# Patient Record
Sex: Male | Born: 1990 | Race: Black or African American | Hispanic: No | Marital: Single | Smoking: Never smoker
Health system: Southern US, Community
[De-identification: ages and names within clinical notes are randomized; demographics above are authoritative.]

## PROBLEM LIST (undated history)

## (undated) DIAGNOSIS — T7840XA Allergy, unspecified, initial encounter: Secondary | ICD-10-CM

## (undated) HISTORY — DX: Allergy, unspecified, initial encounter: T78.40XA

---

## 2012-01-04 ENCOUNTER — Ambulatory Visit (INDEPENDENT_AMBULATORY_CARE_PROVIDER_SITE_OTHER): Payer: Federal, State, Local not specified - PPO | Admitting: Family Medicine

## 2012-01-04 VITALS — BP 116/78 | HR 60 | Temp 98.6°F | Resp 12 | Ht 72.0 in | Wt 190.6 lb

## 2012-01-04 DIAGNOSIS — Z202 Contact with and (suspected) exposure to infections with a predominantly sexual mode of transmission: Secondary | ICD-10-CM

## 2012-01-04 DIAGNOSIS — Z0000 Encounter for general adult medical examination without abnormal findings: Secondary | ICD-10-CM

## 2012-01-04 DIAGNOSIS — H6120 Impacted cerumen, unspecified ear: Secondary | ICD-10-CM

## 2012-01-04 DIAGNOSIS — Z113 Encounter for screening for infections with a predominantly sexual mode of transmission: Secondary | ICD-10-CM

## 2012-01-04 LAB — COMPREHENSIVE METABOLIC PANEL
ALT: 27 U/L (ref 0–53)
AST: 30 U/L (ref 0–37)
Chloride: 99 mEq/L (ref 96–112)
Creat: 1.06 mg/dL (ref 0.50–1.35)
Sodium: 140 mEq/L (ref 135–145)
Total Bilirubin: 0.8 mg/dL (ref 0.3–1.2)

## 2012-01-04 LAB — POCT CBC
Granulocyte percent: 66.9 % (ref 37–80)
HCT, POC: 51.3 % (ref 43.5–53.7)
Hemoglobin: 15.8 g/dL (ref 14.1–18.1)
Lymph, poc: 1.8 (ref 0.6–3.4)
MCH, POC: 28.5 pg (ref 27–31.2)
MCHC: 30.8 g/dL — AB (ref 31.8–35.4)
MCV: 92.4 fL (ref 80–97)
MID (cbc): 0.4 (ref 0–0.9)
MPV: 10.7 fL (ref 0–99.8)
POC Granulocyte: 4.3 (ref 2–6.9)
POC LYMPH PERCENT: 27.5 % (ref 10–50)
POC MID %: 5.6 %M (ref 0–12)
Platelet Count, POC: 290 10*3/uL (ref 142–424)
RBC: 5.55 M/uL (ref 4.69–6.13)
RDW, POC: 12.7 %
WBC: 6.4 10*3/uL (ref 4.6–10.2)

## 2012-01-04 LAB — COMPREHENSIVE METABOLIC PANEL WITH GFR
Albumin: 4.9 g/dL (ref 3.5–5.2)
Alkaline Phosphatase: 76 U/L (ref 39–117)
BUN: 10 mg/dL (ref 6–23)
CO2: 32 meq/L (ref 19–32)
Calcium: 10.7 mg/dL — ABNORMAL HIGH (ref 8.4–10.5)
Glucose, Bld: 85 mg/dL (ref 70–99)
Potassium: 4 meq/L (ref 3.5–5.3)
Total Protein: 8.1 g/dL (ref 6.0–8.3)

## 2012-01-04 MED ORDER — AZITHROMYCIN 250 MG PO TABS: ORAL_TABLET | ORAL | Status: DC

## 2012-01-04 NOTE — Progress Notes (Signed)
[facility]:  Office Visit  Chief Complaint:  Chief Complaint  Patient presents with  . Annual Exam  . SEXUALLY TRANSMITTED DISEASE    check     HPI: Fernando Henderson is a 21 y.o. male who complains of annual and also exposure to chlamydia from girlfriend. He is a Consulting civil engineer at  [employer], Librarian, academic. No prior h/o STI. He recently was told by girlfriend that she has chlamydia. No sxs. Has been tested 2-3 x for STD and has never had one.   Past Medical History  Diagnosis Date  . Allergy    History reviewed. No pertinent past surgical history. History   Social History  . Marital Status: Single    Spouse Name: N/A    Number of Children: N/A  . Years of Education: N/A   Social History Main Topics  . Smoking status: Never Smoker   . Smokeless tobacco: None  . Alcohol Use: Yes  . Drug Use: No  . Sexually Active: Yes    Birth Control/ Protection: Condom   Other Topics Concern  . None   Social History Narrative  . None   Family History  Problem Relation Age of Onset  . Cancer Maternal Grandmother    No Known Allergies Prior to Admission medications   Not on File     ROS: The patient denies fevers, chills, night sweats, unintentional weight loss, chest pain, palpitations, wheezing, dyspnea on exertion, nausea, vomiting, abdominal pain, dysuria, hematuria, melena, numbness, weakness, or tingling. NO urethral dc  All other systems have been reviewed and were otherwise negative with the exception of those mentioned in the HPI and as above.    PHYSICAL EXAM: Filed Vitals:   01/04/12 1430  BP: 116/78  Pulse: 60  Temp: 98.6 F (37 C)  Resp: 12   Filed Vitals:   01/04/12 1430  Height: 6' (1.829 m)  Weight: 190 lb 9.6 oz (86.456 kg)   Body mass index is 25.85 kg/(m^2).  General: Alert, no acute distress HEENT:  Normocephalic, atraumatic, oropharynx patent.  Cardiovascular:  Regular rate and rhythm, no rubs murmurs or gallops.   No Carotid bruits, radial pulse intact. No pedal edema.  Respiratory: Clear to auscultation bilaterally.  No wheezes, rales, or rhonchi.  No cyanosis, no use of accessory musculature GI: No organomegaly, abdomen is soft and non-tender, positive bowel sounds.  No masses. Skin: No rashes. Neurologic: Facial musculature symmetric. Psychiatric: Patient is appropriate throughout our interaction. Lymphatic: No cervical lymphadenopathy Musculoskeletal: Gait intact. Gu normal, circ, no rashes, lesions, dc   LABS: Results for orders placed in visit on 01/04/12  POCT CBC      Component Value Range   WBC 6.4  4.6 - 10.2 K/uL   Lymph, poc 1.8  0.6 - 3.4   POC LYMPH PERCENT 27.5  10 - 50 %L   MID (cbc) 0.4  0 - 0.9   POC MID % 5.6  0 - 12 %M   POC Granulocyte 4.3  2 - 6.9   Granulocyte percent 66.9  37 - 80 %G   RBC 5.55  4.69 - 6.13 M/uL   Hemoglobin 15.8  14.1 - 18.1 g/dL   HCT, POC 56.2  13.0 - 53.7 %   MCV 92.4  80 - 97 fL   MCH, POC 28.5  27 - 31.2 pg   MCHC 30.8 (*) 31.8 - 35.4 g/dL   RDW, POC 86.5     Platelet Count, POC 290  142 - 424 K/uL   MPV 10.7  0 - 99.8 fL     EKG/XRAY:   Primary read interpreted by Dr. Conley Rolls at [facility].   ASSESSMENT/PLAN: Encounter Diagnoses  Name Primary?  . Annual physical exam Yes  . Screening for STD (sexually transmitted disease)   . Cerumen impaction   . Chlamydia contact    Will treat with Azithromycin since has had contact Labs pending Advise to protect himself Excuse note for school just for today    Nalani Andreen PHUONG, DO 01/04/2012 3:16 PM

## 2012-01-05 ENCOUNTER — Telehealth: Payer: Self-pay | Admitting: Family Medicine

## 2012-01-05 LAB — GC/CHLAMYDIA PROBE AMP, URINE
Chlamydia, Swab/Urine, PCR: POSITIVE — AB
GC Probe Amp, Urine: NEGATIVE

## 2012-01-05 LAB — HEPATITIS B SURFACE ANTIGEN: Hepatitis B Surface Ag: NEGATIVE

## 2012-01-05 LAB — HEPATITIS B SURFACE ANTIBODY, QUANTITATIVE: Hep B S AB Quant (Post): 12.2 m[IU]/mL

## 2012-01-05 LAB — HSV(HERPES SIMPLEX VRS) I + II AB-IGG
HSV 1 Glycoprotein G Ab, IgG: <0.1 IV
HSV 2 Glycoprotein G Ab, IgG: <0.1 IV

## 2012-01-05 LAB — RPR

## 2012-01-05 LAB — HEPATITIS C ANTIBODY: HCV Ab: NEGATIVE

## 2012-01-05 LAB — HIV ANTIBODY (ROUTINE TESTING W REFLEX): HIV: NONREACTIVE

## 2012-01-05 NOTE — Telephone Encounter (Signed)
Spoke with patient about test results. + chlamydia. Already presumptively  treated since was exposed.

## 2013-04-12 ENCOUNTER — Ambulatory Visit (INDEPENDENT_AMBULATORY_CARE_PROVIDER_SITE_OTHER): Payer: Federal, State, Local not specified - PPO | Admitting: Endocrinology

## 2013-04-12 ENCOUNTER — Encounter: Payer: Self-pay | Admitting: Endocrinology

## 2013-04-12 LAB — PHOSPHORUS: Phosphorus: 4 mg/dL (ref 2.3–4.6)

## 2013-04-12 NOTE — Progress Notes (Signed)
   Subjective:    Patient ID: Fernando Henderson, male    DOB: 06/26/1990, 23 y.o.   MRN: [id number]  HPI Pt was first noted to have hypercalcemia in December of 2014, on a routine blood test.  He has never had urolithiasis, PUD, pancreatitis, depression.  He does not take vitamin-D or A supplements.  He has had fx fingers (2009) and fibula (2010), each with playing football.   Past Medical History  Diagnosis Date  . Allergy     No past surgical history on file.  History   Social History  . Marital Status: Single    Spouse Name: N/A    Number of Children: N/A  . Years of Education: N/A   Occupational History  . Not on file.   Social History Main Topics  . Smoking status: Never Smoker   . Smokeless tobacco: Not on file  . Alcohol Use: Yes  . Drug Use: No  . Sexual Activity: Yes    Birth Control/ Protection: Condom   Other Topics Concern  . Not on file   Social History Narrative  . No narrative on file    Current Outpatient Prescriptions on File Prior to Visit  Medication Sig Dispense Refill  . azithromycin (ZITHROMAX) 250 MG tablet Take all 4 pills po now  4 tablet  0   No current facility-administered medications on file prior to visit.    No Known Allergies  Family History  Problem Relation Age of Onset  . Cancer Maternal Grandmother   neg for hypercalcemia or urolithiasis  BP 110/60  Pulse 58  Temp(Src) 98.8 F (37.1 C) (Oral)  Ht 6' (1.829 m)  Wt 202 lb (91.627 kg)  BMI 27.39 kg/m2  SpO2 98%  Review of Systems denies galactorrhea, hematuria, memory loss, erectile dysfunction, numbness, arthralgias, abdominal pain, muscle weakness, urinary frequency, hypoglycemia, skin rash, visual loss, sob, diarrhea, rhinorrhea, easy bruising, and depression.  He has lost 15 lbs x 5 months, without trying to.      Objective:   Physical Exam VS: see vs page GEN: no distress HEAD: head: no deformity eyes: no periorbital swelling, no proptosis external nose and  ears are normal mouth: no lesion seen NECK: supple, thyroid is not enlarged CHEST WALL: no deformity LUNGS: clear to auscultation BREASTS:  No gynecomastia CV: reg rate and rhythm, no murmur ABD: abdomen is soft, nontender.  no hepatosplenomegaly.  not distended.  no hernia MUSCULOSKELETAL: muscle bulk and strength are grossly normal.  no obvious joint swelling.  gait is normal and steady EXTEMITIES: no deformity.  no ulcer on the feet.  feet are of normal color and temp.  no edema PULSES: dorsalis pedis intact bilat.  no carotid bruit NEURO:  cn 2-12 grossly intact.   readily moves all 4's.  sensation is intact to touch on the feet SKIN:  Normal texture and temperature.  No rash or suspicious lesion is visible.   NODES:  None palpable at the neck PSYCH: alert, well-oriented.  Does not appear anxious nor depressed.  outside test results are reviewed: Ca++=10.7 Lab Results  Component Value Date   CALCIUM 10.7* 01/04/2012   PHOS 4.0 04/12/2013      Assessment & Plan:  Hypercalcemia, persistent, uncertain etiology Fractures: unlikely related to hypercalcemia Weight loss, unlikely related to weight loss.

## 2013-04-12 NOTE — Patient Instructions (Signed)
blood tests are being requested for you today.  We'll contact you with results.  

## 2013-04-13 LAB — PTH, INTACT AND CALCIUM
Calcium: 10.3 mg/dL (ref 8.4–10.5)
PTH: 36.9 pg/mL (ref 14.0–72.0)

## 2013-04-13 LAB — PROLACTIN: Prolactin: 5.7 ng/mL (ref 2.1–17.1)

## 2013-04-13 LAB — VITAMIN D 25 HYDROXY (VIT D DEFICIENCY, FRACTURES): VIT D 25 HYDROXY: 15 ng/mL — AB (ref 30–89)

## 2013-04-16 LAB — VITAMIN D 1,25 DIHYDROXY
VITAMIN D 1, 25 (OH) TOTAL: 57 pg/mL (ref 18–72)
VITAMIN D3 1, 25 (OH): 57 pg/mL
Vitamin D2 1, 25 (OH)2: <8 pg/mL

## 2013-06-06 ENCOUNTER — Emergency Department (HOSPITAL_COMMUNITY)
Admission: EM | Admit: 2013-06-06 | Discharge: 2013-06-06 | Disposition: A | Discharge status: Home / Self Care | Payer: Federal, State, Local not specified - PPO | Attending: Emergency Medicine | Admitting: Emergency Medicine

## 2013-06-06 ENCOUNTER — Encounter (HOSPITAL_COMMUNITY): Payer: Self-pay | Admitting: Emergency Medicine

## 2013-06-06 ENCOUNTER — Emergency Department (HOSPITAL_COMMUNITY): Payer: Federal, State, Local not specified - PPO

## 2013-06-06 ENCOUNTER — Ambulatory Visit (INDEPENDENT_AMBULATORY_CARE_PROVIDER_SITE_OTHER): Payer: Federal, State, Local not specified - PPO | Admitting: Family Medicine

## 2013-06-06 VITALS — BP 110/70 | HR 74 | Temp 97.7°F | Resp 18 | Ht 70.0 in | Wt 202.0 lb

## 2013-06-06 DIAGNOSIS — R040 Epistaxis: Secondary | ICD-10-CM | POA: Insufficient documentation

## 2013-06-06 DIAGNOSIS — S0512XA Contusion of eyeball and orbital tissues, left eye, initial encounter: Secondary | ICD-10-CM

## 2013-06-06 DIAGNOSIS — Z792 Long term (current) use of antibiotics: Secondary | ICD-10-CM | POA: Insufficient documentation

## 2013-06-06 DIAGNOSIS — IMO0002 Reserved for concepts with insufficient information to code with codable children: Secondary | ICD-10-CM | POA: Insufficient documentation

## 2013-06-06 DIAGNOSIS — S0083XA Contusion of other part of head, initial encounter: Secondary | ICD-10-CM

## 2013-06-06 DIAGNOSIS — Z79899 Other long term (current) drug therapy: Secondary | ICD-10-CM | POA: Insufficient documentation

## 2013-06-06 DIAGNOSIS — S0003XA Contusion of scalp, initial encounter: Secondary | ICD-10-CM | POA: Insufficient documentation

## 2013-06-06 DIAGNOSIS — S1093XA Contusion of unspecified part of neck, initial encounter: Secondary | ICD-10-CM

## 2013-06-06 DIAGNOSIS — Z202 Contact with and (suspected) exposure to infections with a predominantly sexual mode of transmission: Secondary | ICD-10-CM

## 2013-06-06 DIAGNOSIS — S022XXA Fracture of nasal bones, initial encounter for closed fracture: Secondary | ICD-10-CM | POA: Insufficient documentation

## 2013-06-06 DIAGNOSIS — S0010XA Contusion of unspecified eyelid and periocular area, initial encounter: Secondary | ICD-10-CM | POA: Insufficient documentation

## 2013-06-06 MED ORDER — IBUPROFEN 600 MG PO TABS: 600.0000 mg | ORAL_TABLET | Freq: Four times a day (QID) | ORAL | Status: DC | PRN

## 2013-06-06 MED ORDER — AZITHROMYCIN 250 MG PO TABS: ORAL_TABLET | ORAL | Status: DC
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
## 2013-06-06 NOTE — ED Notes (Signed)
Pt A&OX4, ambulatory at discharge, verbalizing no complaints at this time. 
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
## 2013-06-06 NOTE — Patient Instructions (Signed)
Sexually Transmitted Disease A sexually transmitted disease (STD) is a disease or infection that may be passed (transmitted) from person to person, usually during sexual activity. This may happen by way of saliva, semen, blood, vaginal mucus, or urine. Common STDs include:   Gonorrhea.   Chlamydia.   Syphilis.   HIV and AIDS.   Genital herpes.   Hepatitis B and C.   Trichomonas.   Human papillomavirus (HPV).   Pubic lice.   Scabies.  Mites.  Bacterial vaginosis. WHAT ARE CAUSES OF STDs? An STD may be caused by bacteria, a virus, or parasites. STDs are often transmitted during sexual activity if one person is infected. However, they may also be transmitted through nonsexual means. STDs may be transmitted after:   Sexual intercourse with an infected person.   Sharing sex toys with an infected person.   Sharing needles with an infected person or using unclean piercing or tattoo needles.  Having intimate contact with the genitals, mouth, or rectal areas of an infected person.   Exposure to infected fluids during birth. WHAT ARE THE SIGNS AND SYMPTOMS OF STDs? Different STDs have different symptoms. Some people may not have any symptoms. If symptoms are present, they may include:   Painful or bloody urination.   Pain in the pelvis, abdomen, vagina, anus, throat, or eyes.   Skin rash, itching, irritation, growths, sores (lesions), ulcerations, or warts in the genital or anal area.  Abnormal vaginal discharge with or without bad odor.   Penile discharge in men.   Fever.   Pain or bleeding during sexual intercourse.   Swollen glands in the groin area.   Yellow skin and eyes (jaundice). This is seen with hepatitis.   Swollen testicles.  Infertility.  Sores and blisters in the mouth. HOW ARE STDs DIAGNOSED? To make a diagnosis, your health care provider may:   Take a medical history.   Perform a physical exam.   Take a sample of any  discharge for examination.  Swab the throat, cervix, opening to the penis, rectum, or vagina for testing.  Test a sample of your first morning urine.   Perform blood tests.   Perform a Pap smear, if this applies.   Perform a colposcopy.   Perform a laparoscopy.  HOW ARE STDs TREATED? Treatment depends on the STD. Some STDs may be treated but not cured.   Chlamydia, gonorrhea, trichomonas, and syphilis can be cured with antibiotics.   Genital herpes, hepatitis, and HIV can be treated, but not cured, with prescribed medicines. The medicines lessen symptoms.   Genital warts from HPV can be treated with medicine or by freezing, burning (electrocautery), or surgery. Warts may come back.   HPV cannot be cured with medicine or surgery. However, abnormal areas may be removed from the cervix, vagina, or vulva.   If your diagnosis is confirmed, your recent sexual partners need treatment. This is true even if they are symptom-free or have a negative culture or evaluation. They should not have sex until their health care providers say it is OK. HOW CAN I REDUCE MY RISK OF GETTING AN STD?  Use latex condoms, dental dams, and water-soluble lubricants during sexual activity. Do not use petroleum jelly or oils.  Get vaccinated for HPV and hepatitis. If you have not received these vaccines in the past, talk to your health care provider about whether one or both might be right for you.   Avoid risky sex practices that can break the skin.  WHAT SHOULD   I DO IF I THINK I HAVE AN STD?  See your health care provider.   Inform all sexual partners. They should be tested and treated for any STDs.  Do not have sex until your health care provider says it is OK. WHEN SHOULD I GET HELP? Seek immediate medical care if:  You develop severe abdominal pain.  You are a man and notice swelling or pain in the testicles.  You are a woman and notice swelling or pain in your vagina. Document  Released: 05/23/2002 Document Revised: 12/21/2012 Document Reviewed: 09/20/2012 Exit[employer] Patient Information 2014 [employer].  

## 2013-06-06 NOTE — Progress Notes (Signed)
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 He has had STD testing in the last couple months, including HIV testing.  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  Patient has absolutely no symptoms. He's been checked for STDs in the last couple months  Objective: No acute distress Patient has one small pustule in the suprapubic region but otherwise no skin changes in the genitalia  Assessment: Chlamydia exposure  Exposure to STD - Plan: azithromycin (ZITHROMAX) 250 MG tablet, GC/Chlamydia Probe Amp, CANCELED: HIV antibody  Signed, Elvina SidleKurt Efrata Brunner, MD

## 2013-06-07 LAB — GC/CHLAMYDIA PROBE AMP
CT Probe RNA: NEGATIVE
GC Probe RNA: NEGATIVE

## 2013-06-09 NOTE — ED Provider Notes (Signed)
Medical screening examination/treatment/procedure(s) were performed by non-physician practitioner and as supervising physician I was immediately available for consultation/collaboration.   Leeya Rusconi L Americus Scheurich, MD 06/09/13 1018 

## 2013-07-17 ENCOUNTER — Encounter: Payer: Self-pay | Admitting: *Deleted

## 2013-07-17 ENCOUNTER — Ambulatory Visit (INDEPENDENT_AMBULATORY_CARE_PROVIDER_SITE_OTHER): Payer: Federal, State, Local not specified - PPO | Admitting: Emergency Medicine

## 2013-07-17 VITALS — BP 108/64 | HR 71 | Temp 97.8°F | Resp 14 | Ht 71.0 in | Wt 200.0 lb

## 2013-07-17 DIAGNOSIS — Z202 Contact with and (suspected) exposure to infections with a predominantly sexual mode of transmission: Secondary | ICD-10-CM

## 2013-07-17 MED ORDER — CEFTRIAXONE SODIUM 1 G IJ SOLR
250.0000 mg | Freq: Once | INTRAMUSCULAR | Status: AC
  Administered 2013-07-17: 250 mg via INTRAMUSCULAR

## 2013-07-17 NOTE — Progress Notes (Signed)
[facility] [address], [place] [address] [phone]  Date:  07/17/2013   Name:  Fernando Pealaylor D Blumstein   DOB:  11/13/1990   MRN:  [id number]  PCP:  No PCP Per Patient    Chief Complaint: STD testing   History of Present Illness:  Fernando Henderson is a 23 y.o. very pleasant male patient who presents with the following:  College student has been symptomatic after intercourse with a new partner.  Has no discharge. No rash or lesions.  Has dysuria.  History of prior STD treated in past.  No improvement with over the counter medications or other home remedies. Denies other complaint or health concern today.   Patient Active Problem List   Diagnosis Date Noted  . Hypercalcemia 04/12/2013    Past Medical History  Diagnosis Date  . Allergy     History reviewed. No pertinent past surgical history.  History  Substance Use Topics  . Smoking status: Never Smoker   . Smokeless tobacco: Not on file  . Alcohol Use: Yes    Family History  Problem Relation Age of Onset  . Cancer Maternal Grandmother     No Known Allergies  Medication list has been reviewed and updated.  Current Outpatient Prescriptions on File Prior to Visit  Medication Sig Dispense Refill  . albuterol (PROVENTIL HFA;VENTOLIN HFA) 108 (90 BASE) MCG/ACT inhaler Inhale 1-2 puffs into the lungs every 6 (six) hours as needed for wheezing or shortness of breath.      . fexofenadine (ALLEGRA) 180 MG tablet Take 180 mg by mouth daily as needed for allergies or rhinitis.      . mometasone (NASONEX) 50 MCG/ACT nasal spray Place 2 sprays into the nose daily.      Marland Kitchen. azithromycin (ZITHROMAX) 250 MG tablet Take 1,000 mg by mouth once.      Marland Kitchen. ibuprofen (ADVIL,MOTRIN) 600 MG tablet Take 1 tablet (600 mg total) by mouth every 6 (six) hours as needed.  30 tablet  0   No current facility-administered medications on file prior to visit.    Review of Systems:  As per HPI, otherwise negative.     Physical Examination: Filed Vitals:   07/17/13 1421  BP: 108/64  Pulse: 71  Temp: 97.8 F (36.6 C)  Resp: 14   Filed Vitals:   07/17/13 1421  Height: 5\' 11"  (1.803 m)  Weight: 200 lb (90.719 kg)   Body mass index is 27.91 kg/(m^2). Ideal Body Weight: Weight in (lb) to have BMI = 25: 178.9   GEN: WDWN, NAD, Non-toxic, Alert & Oriented x 3 HEENT: Atraumatic, Normocephalic.  Ears and Nose: No external deformity. EXTR: No clubbing/cyanosis/edema NEURO: Normal gait.  PSYCH: Normally interactive. Conversant. Not depressed or anxious appearing.  Calm demeanor.  Genitalia normal male  Assessment and Plan: STD exposure Labs  Rocephin Doxy  Signed,  Phillips OdorJeffery Anderson, MD

## 2013-07-17 NOTE — Patient Instructions (Signed)
Sexually Transmitted Disease A sexually transmitted disease (STD) is a disease or infection that may be passed (transmitted) from person to person, usually during sexual activity. This may happen by way of saliva, semen, blood, vaginal mucus, or urine. Common STDs include:   Gonorrhea.   Chlamydia.   Syphilis.   HIV and AIDS.   Genital herpes.   Hepatitis B and C.   Trichomonas.   Human papillomavirus (HPV).   Pubic lice.   Scabies.  Mites.  Bacterial vaginosis. WHAT ARE CAUSES OF STDs? An STD may be caused by bacteria, a virus, or parasites. STDs are often transmitted during sexual activity if one person is infected. However, they may also be transmitted through nonsexual means. STDs may be transmitted after:   Sexual intercourse with an infected person.   Sharing sex toys with an infected person.   Sharing needles with an infected person or using unclean piercing or tattoo needles.  Having intimate contact with the genitals, mouth, or rectal areas of an infected person.   Exposure to infected fluids during birth. WHAT ARE THE SIGNS AND SYMPTOMS OF STDs? Different STDs have different symptoms. Some people may not have any symptoms. If symptoms are present, they may include:   Painful or bloody urination.   Pain in the pelvis, abdomen, vagina, anus, throat, or eyes.   Skin rash, itching, irritation, growths, sores (lesions), ulcerations, or warts in the genital or anal area.  Abnormal vaginal discharge with or without bad odor.   Penile discharge in men.   Fever.   Pain or bleeding during sexual intercourse.   Swollen glands in the groin area.   Yellow skin and eyes (jaundice). This is seen with hepatitis.   Swollen testicles.  Infertility.  Sores and blisters in the mouth. HOW ARE STDs DIAGNOSED? To make a diagnosis, your health care provider may:   Take a medical history.   Perform a physical exam.   Take a sample of any  discharge for examination.  Swab the throat, cervix, opening to the penis, rectum, or vagina for testing.  Test a sample of your first morning urine.   Perform blood tests.   Perform a Pap smear, if this applies.   Perform a colposcopy.   Perform a laparoscopy.  HOW ARE STDs TREATED? Treatment depends on the STD. Some STDs may be treated but not cured.   Chlamydia, gonorrhea, trichomonas, and syphilis can be cured with antibiotics.   Genital herpes, hepatitis, and HIV can be treated, but not cured, with prescribed medicines. The medicines lessen symptoms.   Genital warts from HPV can be treated with medicine or by freezing, burning (electrocautery), or surgery. Warts may come back.   HPV cannot be cured with medicine or surgery. However, abnormal areas may be removed from the cervix, vagina, or vulva.   If your diagnosis is confirmed, your recent sexual partners need treatment. This is true even if they are symptom-free or have a negative culture or evaluation. They should not have sex until their health care providers say it is OK. HOW CAN I REDUCE MY RISK OF GETTING AN STD?  Use latex condoms, dental dams, and water-soluble lubricants during sexual activity. Do not use petroleum jelly or oils.  Get vaccinated for HPV and hepatitis. If you have not received these vaccines in the past, talk to your health care provider about whether one or both might be right for you.   Avoid risky sex practices that can break the skin.  WHAT SHOULD   I DO IF I THINK I HAVE AN STD?  See your health care provider.   Inform all sexual partners. They should be tested and treated for any STDs.  Do not have sex until your health care provider says it is OK. WHEN SHOULD I GET HELP? Seek immediate medical care if:  You develop severe abdominal pain.  You are a man and notice swelling or pain in the testicles.  You are a woman and notice swelling or pain in your vagina. Document  Released: 05/23/2002 Document Revised: 12/21/2012 Document Reviewed: 09/20/2012 Exit[employer] Patient Information 2014 [employer].  

## 2013-07-18 LAB — HSV(HERPES SIMPLEX VRS) I + II AB-IGG
HSV 1 GLYCOPROTEIN G AB, IGG: 0.13 IV
HSV 2 Glycoprotein G Ab, IgG: <0.1 IV

## 2013-07-18 LAB — GC/CHLAMYDIA PROBE AMP
CT Probe RNA: NEGATIVE
GC Probe RNA: NEGATIVE

## 2013-07-18 LAB — HIV ANTIBODY (ROUTINE TESTING W REFLEX): HIV: NONREACTIVE

## 2013-07-18 LAB — RPR

## 2013-12-14 ENCOUNTER — Ambulatory Visit (INDEPENDENT_AMBULATORY_CARE_PROVIDER_SITE_OTHER): Payer: Federal, State, Local not specified - PPO

## 2014-06-02 ENCOUNTER — Encounter (HOSPITAL_BASED_OUTPATIENT_CLINIC_OR_DEPARTMENT_OTHER): Payer: Self-pay

## 2014-06-02 ENCOUNTER — Emergency Department (HOSPITAL_BASED_OUTPATIENT_CLINIC_OR_DEPARTMENT_OTHER)
Admission: EM | Admit: 2014-06-02 | Discharge: 2014-06-02 | Disposition: A | Discharge status: Home / Self Care | Payer: Federal, State, Local not specified - PPO | Attending: Emergency Medicine | Admitting: Emergency Medicine

## 2014-06-02 ENCOUNTER — Telehealth (HOSPITAL_COMMUNITY): Payer: Self-pay

## 2014-06-02 DIAGNOSIS — J029 Acute pharyngitis, unspecified: Secondary | ICD-10-CM | POA: Insufficient documentation

## 2014-06-02 DIAGNOSIS — R0981 Nasal congestion: Secondary | ICD-10-CM | POA: Insufficient documentation

## 2014-06-02 DIAGNOSIS — R110 Nausea: Secondary | ICD-10-CM | POA: Insufficient documentation

## 2014-06-02 DIAGNOSIS — R6889 Other general symptoms and signs: Secondary | ICD-10-CM

## 2014-06-02 DIAGNOSIS — R197 Diarrhea, unspecified: Secondary | ICD-10-CM | POA: Insufficient documentation

## 2014-06-02 LAB — RAPID STREP SCREEN (MED CTR MEBANE ONLY): Streptococcus, Group A Screen (Direct): NEGATIVE

## 2014-06-02 MED ORDER — GUAIFENESIN-CODEINE 100-10 MG/5ML PO SOLN
10.0000 mL | Freq: Once | ORAL | Status: AC
  Administered 2014-06-02: 10 mL via ORAL
  Filled 2014-06-02: qty 10

## 2014-06-02 MED ORDER — GUAIFENESIN-CODEINE 100-10 MG/5ML PO SOLN: 10.0000 mL | Freq: Three times a day (TID) | ORAL | Status: AC | PRN

## 2014-06-02 MED ORDER — IBUPROFEN 800 MG PO TABS
800.0000 mg | ORAL_TABLET | Freq: Once | ORAL | Status: AC
  Administered 2014-06-02: 800 mg via ORAL
  Filled 2014-06-02: qty 1

## 2014-06-02 MED ORDER — IBUPROFEN 800 MG PO TABS: 800.0000 mg | ORAL_TABLET | Freq: Three times a day (TID) | ORAL | Status: AC

## 2014-06-02 MED ORDER — ACETAMINOPHEN 500 MG PO TABS: 500.0000 mg | ORAL_TABLET | Freq: Four times a day (QID) | ORAL | Status: AC | PRN

## 2014-06-02 NOTE — ED Notes (Signed)
Patient here with sore throat, fever and headache x 4 days

## 2014-06-02 NOTE — ED Provider Notes (Signed)
CSN: [id number]     Arrival date & time 06/02/14  1744 History   First MD Initiated Contact with Patient 06/02/14 1808     Chief Complaint  Patient presents with  . headache, sore throat      (Consider location/radiation/quality/duration/timing/severity/associated sxs/prior Treatment) HPI Comments: Patient is a 24 yo M PMHx significant for allergies presenting to the ED for evaluation of four day history of fever, chills, nausea, nasal congestion, cough, sore throat, diarrhea, generalized throbbing headache, sinus pressure. Patient states he developed the sore throat, diarrhea began two days ago. No medications PTA. No modifying factors identified.    Past Medical History  Diagnosis Date  . Allergy    History reviewed. No pertinent past surgical history. Family History  Problem Relation Age of Onset  . Cancer Maternal Grandmother    History  Substance Use Topics  . Smoking status: Never Smoker   . Smokeless tobacco: Not on file  . Alcohol Use: No    Review of Systems  Constitutional: Positive for fever and chills.  HENT: Positive for congestion, rhinorrhea, sinus pressure and sore throat.   Respiratory: Positive for cough.   Gastrointestinal: Positive for nausea and diarrhea. Negative for vomiting and abdominal pain.  All other systems reviewed and are negative.     Allergies  Review of patient's allergies indicates no known allergies.  Home Medications   Prior to Admission medications   Medication Sig Start Date End Date Taking? Authorizing Provider  fexofenadine (ALLEGRA) 180 MG tablet Take 180 mg by mouth daily as needed for allergies or rhinitis.   Yes Historical Provider, MD  mometasone (NASONEX) 50 MCG/ACT nasal spray Place 2 sprays into the nose daily.   Yes Historical Provider, MD  acetaminophen (TYLENOL) 500 MG tablet Take 1 tablet (500 mg total) by mouth every 6 (six) hours as needed. 06/02/14   Lerry Cordrey, PA-C  guaiFENesin-codeine 100-10 MG/5ML  syrup Take 10 mLs by mouth 3 (three) times daily as needed for cough. 06/02/14   Zaxton Angerer, PA-C  ibuprofen (ADVIL,MOTRIN) 800 MG tablet Take 1 tablet (800 mg total) by mouth 3 (three) times daily. 06/02/14   Adreonna Yontz, PA-C   BP 105/57 mmHg  Pulse 88  Temp(Src) 101 F (38.3 C)  Resp 18  Ht 6\' 1"  (1.854 m)  Wt 210 lb (95.255 kg)  BMI 27.71 kg/m2  SpO2 96% Physical Exam  Constitutional: He is oriented to person, place, and time. He appears well-developed and well-nourished. No distress.  HENT:  Head: Normocephalic and atraumatic.  Right Ear: External ear normal.  Left Ear: External ear normal.  Nose: Nose normal.  Mouth/Throat: Uvula is midline and mucous membranes are normal. No trismus in the jaw. No uvula swelling. Oropharyngeal exudate and posterior oropharyngeal erythema present. No posterior oropharyngeal edema or tonsillar abscesses.  Eyes: Conjunctivae are normal.  Neck: Normal range of motion. Neck supple.  No nuchal rigidity  Cardiovascular: Normal rate, regular rhythm and normal heart sounds.   Pulmonary/Chest: Effort normal and breath sounds normal. No stridor. No respiratory distress.  Abdominal: Soft. There is no tenderness.  Musculoskeletal: Normal range of motion.  Lymphadenopathy:    He has cervical adenopathy.  Neurological: He is alert and oriented to person, place, and time.  Skin: Skin is warm and dry. He is not diaphoretic.  Psychiatric: He has a normal mood and affect.  Nursing note and vitals reviewed.   ED Course  Procedures (including critical care time) Medications  ibuprofen (ADVIL,MOTRIN) tablet 800 mg (800  mg Oral Given 06/02/14 1832)  guaiFENesin-codeine 100-10 MG/5ML solution 10 mL (10 mLs Oral Given 06/02/14 1832)    Labs Review Labs Reviewed  RAPID STREP SCREEN    Imaging Review No results found.   EKG Interpretation None      MDM   Final diagnoses:  Flu-like symptoms    Filed Vitals:   06/02/14 1801   BP: 105/57  Pulse: 88  Temp: 101 F (38.3 C)  Resp: 18    Patient with symptoms consistent with influenza.  Vitals are stable, low-grade fever.  No signs of dehydration, tolerating PO's.  Lungs are clear. Due to patient's presentation and physical exam a chest x-ray was not ordered bc likely diagnosis of flu.  Discussed the cost versus benefit of Tamiflu treatment with the patient.  The patient understands that symptoms are greater than the recommended 24-48 hour window of treatment.  Patient will be discharged with instructions to orally hydrate, rest, and use over-the-counter medications such as anti-inflammatories ibuprofen and Aleve for muscle aches and Tylenol for fever.  Patient will also be given a cough suppressant. Patient is stable at time of discharge   Francee Piccolo, PA-C 06/02/14 1904  Glynn Octave, MD 06/02/14 2117

## 2014-06-02 NOTE — Discharge Instructions (Signed)
Please follow up with your primary care physician in 1-2 days. If you do not have one please call the [facility] number listed above. Please alternate between Motrin and Tylenol every three hours for fevers and pain. Please read all discharge instructions and return precautions.  ° °Influenza °Influenza ("the flu") is a viral infection of the respiratory tract. It occurs more often in winter months because people spend more time in close contact with one another. Influenza can make you feel very sick. Influenza easily spreads from person to person (contagious). °CAUSES  °Influenza is caused by a virus that infects the respiratory tract. You can catch the virus by breathing in droplets from an infected person's cough or sneeze. You can also catch the virus by touching something that was recently contaminated with the virus and then touching your mouth, nose, or eyes. °RISKS AND COMPLICATIONS °You may be at risk for a more severe case of influenza if you smoke cigarettes, have diabetes, have chronic heart disease (such as heart failure) or lung disease (such as asthma), or if you have a weakened immune system. Elderly people and pregnant women are also at risk for more serious infections. The most common problem of influenza is a lung infection (pneumonia). Sometimes, this problem can require emergency medical care and may be life threatening. °SIGNS AND SYMPTOMS  °Symptoms typically last 4 to 10 days and may include: °· Fever. °· Chills. °· Headache, body aches, and muscle aches. °· Sore throat. °· Chest discomfort and cough. °· Poor appetite. °· Weakness or feeling tired. °· Dizziness. °· Nausea or vomiting. °DIAGNOSIS  °Diagnosis of influenza is often made based on your history and a physical exam. A nose or throat swab test can be done to confirm the diagnosis. °TREATMENT  °In mild cases, influenza goes away on its own. Treatment is directed at relieving symptoms. For more severe cases, your  health care provider may prescribe antiviral medicines to shorten the sickness. Antibiotic medicines are not effective because the infection is caused by a virus, not by bacteria. °HOME CARE INSTRUCTIONS °· Take medicines only as directed by your health care provider. °· Use a cool mist humidifier to make breathing easier. °· Get plenty of rest until your temperature returns to normal. This usually takes 3 to 4 days. °· Drink enough fluid to keep your urine clear or pale yellow. °· Cover your mouth and nose when coughing or sneezing, and wash your hands well to prevent the virus from spreading. °· Stay home from work or school until the fever is gone for at least 1 full day. °PREVENTION  °An annual influenza vaccination (flu shot) is the best way to avoid getting influenza. An annual flu shot is now routinely recommended for all adults in the [place] °SEEK MEDICAL CARE IF: °· You experience chest pain, your cough worsens, or you produce more mucus. °· You have nausea, vomiting, or diarrhea. °· Your fever returns or gets worse. °SEEK IMMEDIATE MEDICAL CARE IF: °· You have trouble breathing, you become short of breath, or your skin or nails become bluish. °· You have severe pain or stiffness in the neck. °· You develop a sudden headache, or pain in the face or ear. °· You have nausea or vomiting that you cannot control. °MAKE SURE YOU:  °· Understand these instructions. °· Will watch your condition. °· Will get help right away if you are not doing well or get worse. °Document Released: 02/28/2000 Document Revised: 07/17/2013 Document Reviewed: 06/01/2011 °[employer]® Patient Information ©2015 [employer]. This information is not intended to replace advice given to you by your health care provider. Make sure you   discuss any questions you have with your health care provider. ° °

## 2014-06-02 NOTE — Telephone Encounter (Signed)
Pharmacy calling to verify Rx x 3.  Rx x 3 verified

## 2014-06-06 LAB — CULTURE, GROUP A STREP: STREP A CULTURE: NEGATIVE
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
